# Patient Record
Sex: Male | Born: 2005
Health system: Southern US, Community
[De-identification: ages and names within clinical notes are randomized; demographics above are authoritative.]

## PROBLEM LIST (undated history)

## (undated) DIAGNOSIS — E11649 Type 2 diabetes mellitus with hypoglycemia without coma: Secondary | ICD-10-CM

## (undated) DIAGNOSIS — R6250 Unspecified lack of expected normal physiological development in childhood: Secondary | ICD-10-CM

## (undated) DIAGNOSIS — F419 Anxiety disorder, unspecified: Secondary | ICD-10-CM

## (undated) DIAGNOSIS — F909 Attention-deficit hyperactivity disorder, unspecified type: Secondary | ICD-10-CM

## (undated) DIAGNOSIS — F329 Major depressive disorder, single episode, unspecified: Secondary | ICD-10-CM

## (undated) HISTORY — PX: CIRCUMCISION: SUR203

## (undated) HISTORY — DX: Unspecified lack of expected normal physiological development in childhood: R62.50

## (undated) HISTORY — DX: Attention-deficit hyperactivity disorder, unspecified type: F90.9

## (undated) HISTORY — DX: Type 2 diabetes mellitus with hypoglycemia without coma: E11.649

---
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
## 2024-01-05 ENCOUNTER — Other Ambulatory Visit (HOSPITAL_COMMUNITY): Payer: Self-pay

## 2024-02-07 DIAGNOSIS — F4325 Adjustment disorder with mixed disturbance of emotions and conduct: Secondary | ICD-10-CM | POA: Diagnosis not present

## 2024-02-08 DIAGNOSIS — Z794 Long term (current) use of insulin: Secondary | ICD-10-CM | POA: Diagnosis not present

## 2024-02-08 DIAGNOSIS — E1065 Type 1 diabetes mellitus with hyperglycemia: Secondary | ICD-10-CM | POA: Diagnosis not present

## 2024-02-14 DIAGNOSIS — F4325 Adjustment disorder with mixed disturbance of emotions and conduct: Secondary | ICD-10-CM | POA: Diagnosis not present

## 2024-02-29 ENCOUNTER — Other Ambulatory Visit (HOSPITAL_COMMUNITY): Payer: Self-pay

## 2024-03-05 ENCOUNTER — Other Ambulatory Visit (HOSPITAL_COMMUNITY): Payer: Self-pay

## 2024-03-05 DIAGNOSIS — Z794 Long term (current) use of insulin: Secondary | ICD-10-CM | POA: Diagnosis not present

## 2024-03-05 DIAGNOSIS — E1065 Type 1 diabetes mellitus with hyperglycemia: Secondary | ICD-10-CM | POA: Diagnosis not present

## 2024-03-10 DIAGNOSIS — Z0001 Encounter for general adult medical examination with abnormal findings: Secondary | ICD-10-CM | POA: Diagnosis not present

## 2024-03-26 ENCOUNTER — Encounter: Admitting: Family Medicine

## 2024-05-01 DIAGNOSIS — E103393 Type 1 diabetes mellitus with moderate nonproliferative diabetic retinopathy without macular edema, bilateral: Secondary | ICD-10-CM | POA: Diagnosis not present

## 2024-05-01 DIAGNOSIS — H40053 Ocular hypertension, bilateral: Secondary | ICD-10-CM | POA: Diagnosis not present

## 2024-05-05 DIAGNOSIS — E1065 Type 1 diabetes mellitus with hyperglycemia: Secondary | ICD-10-CM | POA: Diagnosis not present

## 2024-05-05 DIAGNOSIS — Z794 Long term (current) use of insulin: Secondary | ICD-10-CM | POA: Diagnosis not present

## 2024-05-26 DIAGNOSIS — E1065 Type 1 diabetes mellitus with hyperglycemia: Secondary | ICD-10-CM | POA: Diagnosis not present

## 2024-05-26 DIAGNOSIS — E349 Endocrine disorder, unspecified: Secondary | ICD-10-CM | POA: Diagnosis not present

## 2024-06-02 ENCOUNTER — Encounter: Payer: 59 | Admitting: Family Medicine

## 2024-06-04 DIAGNOSIS — R7989 Other specified abnormal findings of blood chemistry: Secondary | ICD-10-CM | POA: Diagnosis not present
